# Patient Record
Sex: Male | Born: 1950 | Race: White | Hispanic: No | Marital: Married | Smoking: Never smoker
Health system: Southern US, Community
[De-identification: ages and names within clinical notes are randomized; demographics above are authoritative.]

## PROBLEM LIST (undated history)

## (undated) DIAGNOSIS — I7100 Dissection of unspecified site of aorta: Secondary | ICD-10-CM

## (undated) DIAGNOSIS — M1990 Unspecified osteoarthritis, unspecified site: Secondary | ICD-10-CM

## (undated) HISTORY — PX: OTHER SURGICAL HISTORY: SHX169

## (undated) HISTORY — PX: CARDIAC SURGERY: SHX584

## (undated) HISTORY — PX: TONSILLECTOMY: SUR1361

---

## 2011-08-11 ENCOUNTER — Emergency Department (INDEPENDENT_AMBULATORY_CARE_PROVIDER_SITE_OTHER)
Admission: EM | Admit: 2011-08-11 | Discharge: 2011-08-11 | Disposition: A | Discharge status: Home / Self Care | Payer: BC Managed Care – PPO | Source: Home / Self Care | Attending: Emergency Medicine | Admitting: Emergency Medicine

## 2011-08-11 DIAGNOSIS — J111 Influenza due to unidentified influenza virus with other respiratory manifestations: Secondary | ICD-10-CM

## 2011-08-11 DIAGNOSIS — R05 Cough: Secondary | ICD-10-CM

## 2011-08-11 DIAGNOSIS — R059 Cough, unspecified: Secondary | ICD-10-CM

## 2011-08-11 MED ORDER — GUAIFENESIN-CODEINE 100-10 MG/5ML PO SYRP: 5.0000 mL | ORAL_SOLUTION | Freq: Four times a day (QID) | ORAL | Status: AC | PRN

## 2011-08-11 NOTE — ED Provider Notes (Signed)
History     CSN: [id number]  Arrival date & time 08/11/11  1128   First MD Initiated Contact with Patient 08/11/11 1150      Chief Complaint  Patient presents with  . Fever    (Consider location/radiation/quality/duration/timing/severity/associated sxs/prior treatment) HPI Lonnie Mitchell is a 60 y.o. male who complains of onset of cold symptoms for 5 days. He had flu symptoms earlier this week, however he is feeling much better now. His wife encouraged him to come in to be checked since he was coughing a little more last night.  All the following symptoms although have resolved except for the cough which is still lingering. +sore throat + cough No pleuritic pain No wheezing + nasal congestion + post-nasal drainage + sinus pain/pressure + chest congestion No itchy/red eyes No earache No hemoptysis No SOB No chills/sweats + fever No nausea No vomiting No abdominal pain No diarrhea No skin rashes + fatigue + myalgias +headache    No past medical history on file.  No past surgical history on file.  No family history on file.  History  Substance Use Topics  . Smoking status: Not on file  . Smokeless tobacco: Not on file  . Alcohol Use: Not on file      Review of Systems  Allergies  Review of patient's allergies indicates no known allergies.  Home Medications   Current Outpatient Rx  Name Route Sig Dispense Refill  . IBUPROFEN 200 MG PO TABS Oral Take 200 mg by mouth every 6 (six) hours as needed.      . GUAIFENESIN-CODEINE 100-10 MG/5ML PO SYRP Oral Take 5 mLs by mouth 4 (four) times daily as needed for cough or congestion. 120 mL 0    BP 114/57  Pulse 81  Temp(Src) 97.9 F (36.6 C) (Oral)  Resp 16  Ht 5\' 7"  (1.702 m)  Wt 159 lb (72.122 kg)  BMI 24.90 kg/m2  SpO2 96%  Physical Exam  Nursing note and vitals reviewed. Constitutional: He is oriented to person, place, and time. He appears well-developed and well-nourished.  HENT:  Head: Normocephalic and  atraumatic.  Right Ear: Tympanic membrane, external ear and ear canal normal.  Left Ear: Tympanic membrane, external ear and ear canal normal.  Nose: Nose normal.  Mouth/Throat: No oropharyngeal exudate, posterior oropharyngeal edema or posterior oropharyngeal erythema.  Eyes: No scleral icterus.  Neck: Neck supple.  Cardiovascular: Regular rhythm and normal heart sounds.   Pulmonary/Chest: Effort normal and breath sounds normal. No respiratory distress.  Neurological: He is alert and oriented to person, place, and time.  Skin: Skin is warm and dry.  Psychiatric: He has a normal mood and affect. His speech is normal.    ED Course  Procedures (including critical care time)  Labs Reviewed - No data to display No results found.   1. Influenza   2. Cough       MDM  1) it seems to be that he had the flu earlier this week. Unlikely given the antibiotic for Tamiflu since he is getting much better. I have given him a prescription for cough medicine. If he is getting worse later this weekend or early next week, I told him to give [place] a call and we'll call in an antibiotic (Z-Pak). 2)  Use nasal saline solution (over the counter) at least 3 times a day. 3)  Use over the counter decongestants like Zyrtec-D every 12 hours as needed to help with congestion.  If you have hypertension, do not  take medicines with sudafed.  4)  Can take tylenol every 6 hours or motrin every 8 hours for pain or fever. 5)  Follow up with your primary doctor if no improvement in 5-7 days, sooner if increasing pain, fever, or new symptoms.     Lily Kocher, MD 08/11/11 1224

## 2011-08-11 NOTE — ED Notes (Signed)
Fever , cough x 4 days 

## 2016-01-02 ENCOUNTER — Encounter: Payer: Self-pay | Admitting: Emergency Medicine

## 2016-01-02 ENCOUNTER — Emergency Department (INDEPENDENT_AMBULATORY_CARE_PROVIDER_SITE_OTHER)
Admission: EM | Admit: 2016-01-02 | Discharge: 2016-01-02 | Disposition: A | Discharge status: Home / Self Care | Payer: BLUE CROSS/BLUE SHIELD | Source: Home / Self Care | Attending: Family Medicine | Admitting: Family Medicine

## 2016-01-02 ENCOUNTER — Emergency Department (INDEPENDENT_AMBULATORY_CARE_PROVIDER_SITE_OTHER): Payer: BLUE CROSS/BLUE SHIELD

## 2016-01-02 DIAGNOSIS — S40022A Contusion of left upper arm, initial encounter: Secondary | ICD-10-CM

## 2016-01-02 DIAGNOSIS — W19XXXA Unspecified fall, initial encounter: Secondary | ICD-10-CM

## 2016-01-02 DIAGNOSIS — S6992XA Unspecified injury of left wrist, hand and finger(s), initial encounter: Secondary | ICD-10-CM

## 2016-01-02 DIAGNOSIS — T1490 Injury, unspecified: Secondary | ICD-10-CM | POA: Diagnosis not present

## 2016-01-02 DIAGNOSIS — T1490XA Injury, unspecified, initial encounter: Secondary | ICD-10-CM

## 2016-01-02 HISTORY — DX: Dissection of unspecified site of aorta: I71.00

## 2016-01-02 HISTORY — DX: Unspecified osteoarthritis, unspecified site: M19.90

## 2016-01-02 NOTE — ED Notes (Signed)
Correction: took Aleve this morning.

## 2016-01-02 NOTE — Discharge Instructions (Signed)
Elevate arm.  Wear sling.  Apply ice pack for 20 to 30 minutes, every 1 to 2 hours.  Continue until pain and swelling decrease.  May take Aleve, 2 tabs twice daily.  Return (or go to ER) if increased swelling occurs around left ring finger.  Begin range of motion and stretching exercises as tolerated.    Contusion A contusion is a deep bruise. Contusions are the result of a blunt injury to tissues and muscle fibers under the skin. The injury causes bleeding under the skin. The skin overlying the contusion may turn blue, purple, or yellow. Minor injuries will give you a painless contusion, but more severe contusions may stay painful and swollen for a few weeks.  CAUSES  This condition is usually caused by a blow, trauma, or direct force to an area of the body. SYMPTOMS  Symptoms of this condition include:  Swelling of the injured area.  Pain and tenderness in the injured area.  Discoloration. The area may have redness and then turn blue, purple, or yellow. DIAGNOSIS  This condition is diagnosed based on a physical exam and medical history. An X-ray, CT scan, or MRI may be needed to determine if there are any associated injuries, such as broken bones (fractures). TREATMENT  Specific treatment for this condition depends on what area of the body was injured. In general, the best treatment for a contusion is resting, icing, applying pressure to (compression), and elevating the injured area. This is often called the RICE strategy. Over-the-counter anti-inflammatory medicines may also be recommended for pain control.  HOME CARE INSTRUCTIONS   Rest the injured area.  If directed, apply ice to the injured area:  Put ice in a plastic bag.  Place a towel between your skin and the bag.  Leave the ice on for 20 minutes, 2-3 times per day.  If directed, apply light compression to the injured area using an elastic bandage. Make sure the bandage is not wrapped too tightly. Remove and reapply the  bandage as directed by your health care provider.  If possible, raise (elevate) the injured area above the level of your heart while you are sitting or lying down.  Take over-the-counter and prescription medicines only as told by your health care provider. SEEK MEDICAL CARE IF:  Your symptoms do not improve after several days of treatment.  Your symptoms get worse.  You have difficulty moving the injured area. SEEK IMMEDIATE MEDICAL CARE IF:   You have severe pain.  You have numbness in a hand or foot.  Your hand or foot turns pale or cold.   This information is not intended to replace advice given to you by your health care provider. Make sure you discuss any questions you have with your health care provider.   Document Released: 05/17/2005 Document Revised: 04/28/2015 Document Reviewed: 12/23/2014 Elsevier Interactive Patient Education [employer]2016 [employer] Inc.

## 2016-01-02 NOTE — ED Provider Notes (Signed)
CSN: [id number]     Arrival date & time 01/02/16  1153 History   First MD Initiated Contact with Patient 01/02/16 1254     Chief Complaint  Patient presents with  . Arm Injury      HPI Comments: While playing baseball yesterday, patient fell on his left elbow/forearm.  He has had gradually increasing pain and swelling in his left elbow and forearm.   Patient is a 65 y.o. male presenting with arm injury. The history is provided by the patient.  Arm Injury Location:  Elbow and arm Time since incident:  1 day Injury: yes   Mechanism of injury: fall   Fall:    Fall occurred:  Running   Impact surface:  Dirt   Point of impact: left elbow and forearm. Arm location:  L forearm Elbow location:  L elbow Pain details:    Quality:  Aching   Radiates to:  L wrist   Severity:  Moderate   Onset quality:  Sudden   Duration:  1 day   Timing:  Constant   Progression:  Unchanged Chronicity:  New Handedness:  Right-handed Dislocation: no   Prior injury to area:  No Relieved by:  NSAIDs Worsened by:  Movement Ineffective treatments:  Ice Associated symptoms: decreased range of motion, stiffness and swelling   Associated symptoms: no fatigue, no muscle weakness, no numbness and no tingling     Past Medical History  Diagnosis Date  . Dissection, aorta (HCC)   . Arthritis    Past Surgical History  Procedure Laterality Date  . Aorta dissection    . Tonsillectomy     Family History  Problem Relation Age of Onset  . Hypertension Father   . Heart failure Father   . Heart failure Sister   . Heart failure Other   . Hypertension Other    Social History  Substance Use Topics  . Smoking status: Never Smoker   . Smokeless tobacco: None  . Alcohol Use: Yes    Review of Systems  Constitutional: Negative for fatigue.  Musculoskeletal: Positive for stiffness.  All other systems reviewed and are negative.   Allergies  Review of patient's allergies indicates no known  allergies.  Home Medications   Prior to Admission medications   Medication Sig Start Date End Date Taking? Authorizing Provider  ibuprofen (ADVIL,MOTRIN) 200 MG tablet Take 200 mg by mouth every 6 (six) hours as needed.      Historical Provider, MD   Meds Ordered and Administered this Visit  Medications - No data to display  BP 143/60 mmHg  Pulse 75  Temp(Src) 98.2 F (36.8 C) (Oral)  Resp 16  Ht 5\' 8"  (1.727 m)  Wt 160 lb (72.576 kg)  BMI 24.33 kg/m2  SpO2 98% No data found.   Physical Exam  Constitutional: He is oriented to person, place, and time. He appears well-developed and well-nourished. No distress.  HENT:  Head: Normocephalic.  Eyes: Pupils are equal, round, and reactive to light.  Neck: Normal range of motion.  Cardiovascular: Normal heart sounds.   Pulmonary/Chest: Breath sounds normal.  Musculoskeletal:       Left forearm: He exhibits tenderness, bony tenderness, swelling and edema. He exhibits no deformity and no laceration.       Arms: Patient has difficulty fully flexing and extending left elbow although there is only mild tenderness to palpation.  The proximal forearm and elbow are diffusely swollen.  There is tenderness to palpation over proximal forearm just distal  to elbow.  Distal neurovascular function is intact.  There is edema over dorsum of left hand.  Left 4th finger has a ring in place; unable to remove because of developing edema, but no vascular compromise is apparent.  Neurological: He is alert and oriented to person, place, and time.  Skin: Skin is warm and dry. No rash noted.  Nursing note and vitals reviewed.   ED Course  Procedures none  Imaging Review Dg Forearm Left  01/02/2016  CLINICAL DATA:  Fall last night, injury left forearm EXAM: LEFT FOREARM - 2 VIEW COMPARISON:  None. FINDINGS: Two views of left forearm submitted. No acute fracture or subluxation. Diffuse soft tissue swelling is noted left elbow and proximal left forearm.  IMPRESSION: No acute fracture or subluxation.  Diffuse soft tissue swelling. Electronically Signed   By: Natasha Mead M.D.   On: 01/02/2016 13:19      MDM   1. Contusion of left arm, initial encounter   2. Injury    Ace wrap applied.  Dispensed shoulder immobilizer. Elevate arm.  Wear sling.  Apply ice pack for 20 to 30 minutes, every 1 to 2 hours.  Continue until pain and swelling decrease.  May take Aleve, 2 tabs twice daily.  Return (or go to ER) if increased swelling occurs around left ring finger.  Begin range of motion and stretching exercises as tolerated.  Followup with Dr. Rodney Langton or Dr. Clementeen Graham ([facility]) if not improving about two weeks.     Lattie Haw, MD 01/02/16 [phone]

## 2016-01-02 NOTE — ED Notes (Signed)
Patient states he fell while playing baseball yesterday and landed on left forearm which is now edematous and painful. Took his daily ibuprofen this morning.

## 2016-01-03 ENCOUNTER — Emergency Department (INDEPENDENT_AMBULATORY_CARE_PROVIDER_SITE_OTHER)
Admission: EM | Admit: 2016-01-03 | Discharge: 2016-01-03 | Disposition: A | Discharge status: Home / Self Care | Payer: BLUE CROSS/BLUE SHIELD | Source: Home / Self Care | Attending: Family Medicine | Admitting: Family Medicine

## 2016-01-03 ENCOUNTER — Encounter: Payer: Self-pay | Admitting: Emergency Medicine

## 2016-01-03 DIAGNOSIS — W4904XD Ring or other jewelry causing external constriction, subsequent encounter: Secondary | ICD-10-CM | POA: Diagnosis not present

## 2016-01-03 DIAGNOSIS — S60449D External constriction of unspecified finger, subsequent encounter: Secondary | ICD-10-CM | POA: Diagnosis not present

## 2016-01-03 DIAGNOSIS — S4992XD Unspecified injury of left shoulder and upper arm, subsequent encounter: Secondary | ICD-10-CM

## 2016-01-03 NOTE — ED Notes (Signed)
Patient here with left hand and finger edema exacerbated by wedding ring; requesting assistance in removing ring.

## 2016-01-03 NOTE — ED Provider Notes (Signed)
CSN: [id number]     Arrival date & time 01/03/16  1630 History   First MD Initiated Contact with Patient 01/03/16 1637     No chief complaint on file.  (Consider location/radiation/quality/duration/timing/severity/associated sxs/prior Treatment) HPI The pt is a 65yo male presenting to [facility] with c/o worsening swelling and pain in left forearm and hand after a Left forearm injury 2 days ago falling on arm playing baseball. Pt was seen at [facility] yesterday. Imaging was negative for fracture. Pt given ace-wrap and shoulder immobilizer. Encouraged to ice every 1-2 hours and keep elevated. Pt has not been using ice today. Symptoms worsened when he went to work today.  No new injury. Pt states swelling has worsened around his wedding band causing some mild pain.   Past Medical History  Diagnosis Date  . Dissection, aorta (HCC)   . Arthritis    Past Surgical History  Procedure Laterality Date  . Aorta dissection    . Tonsillectomy     Family History  Problem Relation Age of Onset  . Hypertension Father   . Heart failure Father   . Heart failure Sister   . Heart failure Other   . Hypertension Other    Social History  Substance Use Topics  . Smoking status: Never Smoker   . Smokeless tobacco: Not on file  . Alcohol Use: Yes    Review of Systems  Musculoskeletal: Positive for myalgias, joint swelling and arthralgias.       Left forearm and hand  Skin: Positive for color change. Negative for wound.  Neurological: Negative for weakness and numbness.    Allergies  Review of patient's allergies indicates no known allergies.  Home Medications   Prior to Admission medications   Medication Sig Start Date End Date Taking? Authorizing Provider  ibuprofen (ADVIL,MOTRIN) 200 MG tablet Take 200 mg by mouth every 6 (six) hours as needed.      Historical Provider, MD   Meds Ordered and Administered this Visit  Medications - No data to display  There were no vitals taken for this visit. No  data found.   Physical Exam  Constitutional: He is oriented to person, place, and time. He appears well-developed and well-nourished.  HENT:  Head: Normocephalic and atraumatic.  Eyes: EOM are normal.  Neck: Normal range of motion.  Cardiovascular: Normal rate.   Pulses:      Radial pulses are 2+ on the left side.  Left hand: cap refill < 3 seconds  Pulmonary/Chest: Effort normal.  Musculoskeletal: Normal range of motion. He exhibits edema and tenderness.  Left arm: moderate to significant edema of Left forearm and hand. Full ROM shoulder and elbow. Mild limitation to flexion and extension of wrist and 4/5 grip strength with Left hand due to swelling. Wedding band in place on Left ring finger.muscle compartments are soft.  Neurological: He is alert and oriented to person, place, and time.  Left hand: normal sensation  Skin: Skin is warm and dry. No erythema.  Left forearm and hand: diffuse ecchymosis.  Psychiatric: He has a normal mood and affect. His behavior is normal.  Nursing note and vitals reviewed.   ED Course  Procedures (including critical care time)  Labs Review Labs Reviewed - No data to display  Imaging Review Dg Forearm Left  01/02/2016  CLINICAL DATA:  Fall last night, injury left forearm EXAM: LEFT FOREARM - 2 VIEW COMPARISON:  None. FINDINGS: Two views of left forearm submitted. No acute fracture or subluxation. Diffuse soft tissue  swelling is noted left elbow and proximal left forearm. IMPRESSION: No acute fracture or subluxation.  Diffuse soft tissue swelling. Electronically Signed   By: Natasha MeadLiviu  Pop M.D.   On: 01/02/2016 13:19     MDM   1. Constrictive jewelry of finger, subsequent encounter   2. Arm injury, left, subsequent encounter    [facility] after worsening swelling from Left arm injury.  Consulted with Dr. Benjamin Stainhekkekandam, Sports Medicine, who was able to remove ring using dental floss. No immediate complications.   Left arm re-wrapped with ace bandage. Pt shown how to properly wrap from hand up toward shoulder to help with swelling.  Encouraged to ice a few times a day. Elevate when possible.  F/u with Sports Medicine in 1-2 weeks for recheck of symptoms.    Junius Finnerrin O'Malley, PA-C 01/03/16 1701

## 2016-01-07 ENCOUNTER — Encounter: Payer: Self-pay | Admitting: Sports Medicine

## 2016-01-07 ENCOUNTER — Ambulatory Visit (INDEPENDENT_AMBULATORY_CARE_PROVIDER_SITE_OTHER): Payer: BLUE CROSS/BLUE SHIELD | Admitting: Sports Medicine

## 2016-01-07 VITALS — BP 151/64 | HR 81 | Resp 16 | Wt 161.0 lb

## 2016-01-07 DIAGNOSIS — S5012XA Contusion of left forearm, initial encounter: Secondary | ICD-10-CM | POA: Insufficient documentation

## 2016-01-07 NOTE — Progress Notes (Signed)
   Subjective:    I'm seeing this patient as a consultation for:  Junius FinnerErin O'Malley PA-C  CC: Left forearm swelling  HPI: This is a pleasant 65 year old male, a week ago he slipped and fell, he had immediate pain, swelling, bruising over his left forearm, I removed his ring in the urgent care, and he was referred to me for further evaluation and definitive treatment of his forearm swelling/hematoma. X-rays were negative. Symptoms are mild, improving.  Past medical history, Surgical history, Family history not pertinant except as noted below, Social history, Allergies, and medications have been entered into the medical record, reviewed, and no changes needed.   Review of Systems: No headache, visual changes, nausea, vomiting, diarrhea, constipation, dizziness, abdominal pain, skin rash, fevers, chills, night sweats, weight loss, swollen lymph nodes, body aches, joint swelling, muscle aches, chest pain, shortness of breath, mood changes, visual or auditory hallucinations.   Objective:   General: Well Developed, well nourished, and in no acute distress.  Neuro/Psych: Alert and oriented x3, extra-ocular muscles intact, able to move all 4 extremities, sensation grossly intact. Skin: Warm and dry, no rashes noted.  Respiratory: Not using accessory muscles, speaking in full sentences, trachea midline.  Cardiovascular: Pulses palpable, no extremity edema. Abdomen: Does not appear distended. Left Elbow: Visible large hematoma distal to the elbow joint. Range of motion full pronation, supination, flexion, extension. Wrist also with full range of motion and full strength in all directions Strength is full to all of the above directions Stable to varus, valgus stress. Negative moving valgus stress test. No discrete areas of tenderness to palpation. Ulnar nerve does not sublux. Negative cubital tunnel Tinel's.  Procedure: Real-time Ultrasound Guided Injection of traumatic hematoma left forearm Device:  GE Logiq E  Verbal informed consent obtained.  Time-out conducted.  Noted no overlying erythema, induration, or other signs of local infection.  Skin prepped in a sterile fashion.  Local anesthesia: Topical Ethyl chloride.  With sterile technique and under real time ultrasound guidance:  Noted organized hematoma in the left forearm, and somewhat intramuscular the extensor carpi radialis longus and likely brachioadialis, 18-gauge needle advanced into this guidance, unable to aspirate any fluid, syringe switched and 1 mL lidocaine, 1 mL Kenalog 40 injected easily. Completed without difficulty  Pain immediately resolved suggesting accurate placement of the medication.  Advised to call if fevers/chills, erythema, induration, drainage, or persistent bleeding.  Images permanently stored and available for review in the ultrasound unit.  Impression: Technically successful ultrasound guided injection.  The arm was then strapped with compressive dressing.  Impression and Recommendations:   This case required medical decision making of moderate complexity.

## 2016-01-07 NOTE — Assessment & Plan Note (Addendum)
Attempt at aspiration with injection. Discontinue sling. Exam is fairly benign with the exception of some swelling. We were able to get his ring off at the last visit in urgent care without any complications. Strapped the arm again with compressive dressing, he will return to see me in 2 weeks.

## 2016-01-20 ENCOUNTER — Encounter: Payer: Self-pay | Admitting: Sports Medicine

## 2016-01-20 ENCOUNTER — Ambulatory Visit (INDEPENDENT_AMBULATORY_CARE_PROVIDER_SITE_OTHER): Payer: BLUE CROSS/BLUE SHIELD | Admitting: Sports Medicine

## 2016-01-20 VITALS — BP 147/61 | HR 76 | Resp 18 | Wt 157.0 lb

## 2016-01-20 DIAGNOSIS — S5012XD Contusion of left forearm, subsequent encounter: Secondary | ICD-10-CM

## 2016-01-20 NOTE — Progress Notes (Signed)
  Subjective:    CC: Follow-up  HPI: Left forearm hematoma: Improved with softening of the hematoma after injection at the last visit, but still with some persistence of the swelling. Pain is mild.  Past medical history, Surgical history, Family history not pertinant except as noted below, Social history, Allergies, and medications have been entered into the medical record, reviewed, and no changes needed.   Review of Systems: No fevers, chills, night sweats, weight loss, chest pain, or shortness of breath.   Objective:    General: Well Developed, well nourished, and in no acute distress.  Neuro: Alert and oriented x3, extra-ocular muscles intact, sensation grossly intact.  HEENT: Normocephalic, atraumatic, pupils equal round reactive to light, neck supple, no masses, no lymphadenopathy, thyroid nonpalpable.  Skin: Warm and dry, no rashes. Cardiac: Regular rate and rhythm, no murmurs rubs or gallops, no lower extremity edema.  Respiratory: Clear to auscultation bilaterally. Not using accessory muscles, speaking in full sentences.  Complicated Incision and evacuation of deep hematoma, forearm, left. Risks, benefits, and alternatives explained and consent obtained. Time out conducted. Surface cleaned with alcohol. 10cc lidocaine with epinephine infiltrated around hematoma. Adequate anesthesia ensured. Area prepped and draped in a sterile fashion. #11 blade used to make a stab incision into abscess. Large amounts of hematoma was expressed with pressure Curved hemostat used to explore 4 quadrants and loculations broken up. Further hematoma expressed Incision was then closed with a 4-0 Prolene horizontal mattress suture. Small amount of Dermabond applied over the top of the incision. Hemostasis achieved. Pt stable. Aftercare and follow-up advised. The area was then strapped with compressive dressing.  Impression and Recommendations:

## 2016-01-20 NOTE — Assessment & Plan Note (Signed)
Softened somewhat with injection at the last visit, incision and hematoma evacuation performed today. Closed with a single horizontal mattress suture, strapped with compressive dressing, return in one week to consider suture removal, I did place a bit of Dermabond over the incision

## 2016-01-26 ENCOUNTER — Ambulatory Visit (INDEPENDENT_AMBULATORY_CARE_PROVIDER_SITE_OTHER): Payer: BLUE CROSS/BLUE SHIELD | Admitting: Sports Medicine

## 2016-01-26 ENCOUNTER — Encounter: Payer: Self-pay | Admitting: Sports Medicine

## 2016-01-26 DIAGNOSIS — S5012XD Contusion of left forearm, subsequent encounter: Secondary | ICD-10-CM

## 2016-01-26 MED ORDER — DOXYCYCLINE HYCLATE 100 MG PO TABS: 100.0000 mg | ORAL_TABLET | Freq: Two times a day (BID) | ORAL | Status: AC

## 2016-01-26 NOTE — Assessment & Plan Note (Signed)
Sutures removed, mild cellulitis, there is some breakdown of the skin after degloving injury. There is only serous and nonpurulent discharge. Closed with Steri-Strips and Dermabond, compressed, referral to wound care for assistance. Adding doxycycline.

## 2016-01-26 NOTE — Progress Notes (Signed)
  Subjective:  Follow-up after hematoma evacuation, overall doing well, does have some skin breakdown approximately with serous drainage, some stiffness but no fevers, chills, or other constitutional symptoms.  Objective: General: Well-developed, well-nourished, and in no acute distress. Left forearm: Minimally swollen, mildly erythema with induration, there is some skin breakdown proximal to the incision site, there is simply a serous discharge from this location.  The skin breakdown was closed with Steri-Strips and Dermabond and I removed his sutures. Good motion of the elbow without pain.  Assessment/plan:

## 2016-01-31 ENCOUNTER — Telehealth: Payer: Self-pay

## 2016-01-31 DIAGNOSIS — S5012XD Contusion of left forearm, subsequent encounter: Secondary | ICD-10-CM

## 2016-01-31 NOTE — Telephone Encounter (Signed)
Continue antibiotics and placing referral [place]

## 2016-01-31 NOTE — Telephone Encounter (Signed)
Pt left VM stating he has not heard from would clinic and he has a couple more lesions that are oozing yellow liquid. Pt would also like to know if [place] [place]. Please advise.

## 2016-02-09 ENCOUNTER — Encounter: Payer: Self-pay | Admitting: Sports Medicine

## 2016-02-09 ENCOUNTER — Ambulatory Visit (INDEPENDENT_AMBULATORY_CARE_PROVIDER_SITE_OTHER): Payer: BLUE CROSS/BLUE SHIELD | Admitting: Sports Medicine

## 2016-02-09 VITALS — BP 167/62 | HR 80 | Resp 18 | Wt 161.7 lb

## 2016-02-09 DIAGNOSIS — S5012XD Contusion of left forearm, subsequent encounter: Secondary | ICD-10-CM

## 2016-02-09 NOTE — Assessment & Plan Note (Addendum)
This is unfortunately now resulted in breakdown of the skin down to the muscle fascia, the skin was likely devitalized when distended with a hematoma. There is an opening approximately 4 cm x 6 cm across He is now seeing a Engineer, petroleumplastic surgeon, who seems to be planning for a wound VAC rather than skin grafting. I will follow along, return to see me in one month.

## 2016-02-09 NOTE — Progress Notes (Signed)
  Subjective:    CC: Follow-up  HPI: This is a pleasant 65 year old male, he had a traumatic hematoma on his forearm, this was evacuated, but unfortunately the skin was devitalized long enough to cause breakdown. He now has a skin defect approximately 6 cm x 4 cm across, we have had him seeing wound care, more recently he has got an appointment with plastic surgery for consideration of wound VAC. He has no pain.  Past medical history, Surgical history, Family history not pertinant except as noted below, Social history, Allergies, and medications have been entered into the medical record, reviewed, and no changes needed.   Review of Systems: No fevers, chills, night sweats, weight loss, chest pain, or shortness of breath.   Objective:    General: Well Developed, well nourished, and in no acute distress.  Neuro: Alert and oriented x3, extra-ocular muscles intact, sensation grossly intact.  HEENT: Normocephalic, atraumatic, pupils equal round reactive to light, neck supple, no masses, no lymphadenopathy, thyroid nonpalpable.  Skin: Warm and dry, no rashes. Cardiac: Regular rate and rhythm, no murmurs rubs or gallops, no lower extremity edema.  Respiratory: Clear to auscultation bilaterally. Not using accessory muscles, speaking in full sentences. Left forearm: 6 x 4 cm skin defect with visible muscle fascia underneath, no erythema, no induration, no drainage.  Impression and Recommendations:

## 2016-03-15 ENCOUNTER — Ambulatory Visit: Payer: BLUE CROSS/BLUE SHIELD | Admitting: Sports Medicine

## 2016-10-10 DIAGNOSIS — D649 Anemia, unspecified: Secondary | ICD-10-CM | POA: Insufficient documentation

## 2016-10-10 DIAGNOSIS — I10 Essential (primary) hypertension: Secondary | ICD-10-CM | POA: Insufficient documentation

## 2016-10-10 DIAGNOSIS — E785 Hyperlipidemia, unspecified: Secondary | ICD-10-CM | POA: Insufficient documentation

## 2016-10-10 DIAGNOSIS — D696 Thrombocytopenia, unspecified: Secondary | ICD-10-CM | POA: Insufficient documentation

## 2016-12-13 ENCOUNTER — Ambulatory Visit (INDEPENDENT_AMBULATORY_CARE_PROVIDER_SITE_OTHER): Payer: BLUE CROSS/BLUE SHIELD | Admitting: Family Medicine

## 2016-12-13 VITALS — BP 91/55 | HR 71 | Temp 98.1°F | Wt 149.0 lb

## 2016-12-13 DIAGNOSIS — L600 Ingrowing nail: Secondary | ICD-10-CM | POA: Diagnosis not present

## 2016-12-13 MED ORDER — TRAMADOL HCL 50 MG PO TABS: 50.0000 mg | ORAL_TABLET | Freq: Four times a day (QID) | ORAL | 0 refills | Status: DC | PRN

## 2016-12-13 NOTE — Progress Notes (Signed)
Subjective:    Patient ID: Lonnie Mitchell, male    DOB: Apr 30, 1951, 66 y.o.   MRN: [id number]  HPI 66 year old male status post open heart surgery 8 weeks ago comes in today complaining of ingrown toenail on the left great toe.  Assessment bothering him on and off for weeks but it's benefit been more sore the last couple of days. No fevers chills or sweats. He has had to have this same nail removed in the past.     Review of Systems  BP (!) 91/55   Pulse 71   Temp 98.1 F (36.7 C) (Oral)   Wt 149 lb (67.6 kg)   BMI 22.66 kg/m     No Known Allergies  Past Medical History:  Diagnosis Date  . Arthritis   . Dissection, aorta [facility])     Past Surgical History:  Procedure Laterality Date  . aorta dissection    . TONSILLECTOMY      Social History   Social History  . Marital status: Married    Spouse name: N/A  . Number of children: N/A  . Years of education: N/A   Occupational History  . Not on file.   Social History Main Topics  . Smoking status: Never Smoker  . Smokeless tobacco: Not on file  . Alcohol use Yes  . Drug use: Unknown  . Sexual activity: Not on file   Other Topics Concern  . Not on file   Social History Narrative  . No narrative on file    Family History  Problem Relation Age of Onset  . Hypertension Father   . Heart failure Father   . Heart failure Sister   . Heart failure Other   . Hypertension Other     Outpatient Encounter Prescriptions as of 12/13/2016  Medication Sig  . aspirin EC 81 MG tablet Take 81 mg by mouth daily.  Marland Kitchen atorvastatin (LIPITOR) 40 MG tablet Take 40 mg by mouth at bedtime.  . carvedilol (COREG) 3.125 MG tablet Take 3.125 mg by mouth 2 (two) times daily.  Marland Kitchen ibuprofen (ADVIL,MOTRIN) 200 MG tablet Take 200 mg by mouth every 6 (six) hours as needed. Reported on 02/09/2016  . lisinopril (PRINIVIL,ZESTRIL) 5 MG tablet Take 5 mg by mouth daily.  . tadalafil (CIALIS) 10 MG tablet Take 10 mg by mouth daily as needed.  .  traMADol (ULTRAM) 50 MG tablet Take 1 tablet (50 mg total) by mouth every 6 (six) hours as needed.   No facility-administered encounter medications on file as of 12/13/2016.          Objective:   Physical Exam  Constitutional: He is oriented to person, place, and time. He appears well-developed and well-nourished.  HENT:  Head: Normocephalic and atraumatic.  Eyes: Conjunctivae and EOM are normal.  Cardiovascular: Normal rate.   Pulmonary/Chest: Effort normal.  Neurological: He is alert and oriented to person, place, and time.  Skin: Skin is dry. No pallor.  Left great toe along the lateral border it is erythematous. When I pressed on the area I actually was able to express some green colored pus. It is tender at the distal edge of the nail. No tenderness over the base of the nail with a matrixes. He has some thickening and deformity of the distal end of the nail as well.  Psychiatric: He has a normal mood and affect. His behavior is normal.  Vitals reviewed.         Assessment & Plan:  Left great  ingrown toenail - Discussed options. Recommend partial nail removal. Patient tolerated procedure well. Follow-up wound care recommend elevate today. Ice as needed. Can use Tylenol.  Toenail Avulsion Procedure Note  Pre-operative Diagnosis: Left Ingrown Great toenail   Post-operative Diagnosis: Left Ingrown Great toenail  Indications: pain, infection   Anesthesia: Lidocaine 1% without epinephrine without added sodium bicarbonate  Procedure Details   The risks (including bleeding and infection) and benefits of the  procedure and Verbal informed consent obtained.  After digital block anesthesia was obtained, a tourniquet was applied for hemostasis during the procedure.  After prepping with Hibiclens, the offending edge of the nail was freed from the nailbed and perionychium, and then split with scissors and removed with  forceps.  All visible granulation tissue is debrided.  Antibiotic and bulky dressing was applied.   Findings: Ingrown toenail with infection.   Complications: none.  Plan: 1. The dressing on until tomorrow. Okay to remove and get wet in the shower. Pat dry and apply Vaseline and cover for a couple of days. 2. Warning signs of infection were reviewed.   3. Recommended that the patient use tramadol and tyelnol as needed for pain. Avoid NSAIDs as he had recent open heart surgery. 4. Return PRN

## 2016-12-13 NOTE — Patient Instructions (Addendum)
Fingernail or Toenail Removal, Adult, Care After  This sheet gives you information about how to care for yourself after your procedure. Your health care provider may also give you more specific instructions. If you have problems or questions, contact your health care provider.  What can I expect after the procedure?  After the procedure, it is common to have:  · Pain.  · Redness.  · Swelling.  · Soreness.     Follow these instructions at home:  · If you have a splint:  ? Do not put pressure on any part of the splint until it is fully hardened. This may take several hours.  ? Wear the splint as told by your health care provider. Remove it only as told by your health care provider.  ? Loosen the splint if your fingers or toes tingle, become numb, or turn cold and blue.  ? Keep the splint clean.  ? If the splint is not waterproof:  § Do not let it get wet.  § Cover it with a watertight covering when you take a bath or a shower.  Wound care        · Follow instructions from your health care provider about how to take care of your wound. Make sure you:  ? Wash your hands with soap and water before you change your bandage (dressing). If soap and water are not available, use hand sanitizer.  ? Change your dressing as told by your health care provider.  ? Keep your dressing dry until your health care provider says it can be removed.  ? Leave stitches (sutures), skin glue, or adhesive strips in place. These skin closures may need to stay in place for 2 weeks or longer. If adhesive strip edges start to loosen and curl up, you may trim the loose edges. Do not remove adhesive strips completely unless your health care provider tells you to do that.  · Check your wound every day for signs of infection. Check for:  ? More redness, swelling, or pain.  ? More fluid or blood.  ? Warmth.  ? Pus or a bad smell.  Managing pain, stiffness, and swelling   · Move your fingers or toes often to avoid stiffness and to lessen  swelling.  · Raise (elevate) the injured area above the level of your heart while you are sitting or lying down. You may need to keep your finger or toe raised or supported on a pillow for 24 hours or as told by your health care provider.  · Soak your hand or foot in warm, soapy water for 10-20 minutes, 3 times a day or as told by your health care provider.  Medicine   · Take over-the-counter and prescription medicines only as told by your health care provider.  · If you were prescribed an antibiotic medicine, use it as told by your health care provider. Do not stop using the antibiotic even if your condition improves.  General instructions   · If you were given a shoe to wear, wear it as told by your health care provider.  · Keep all follow-up visits as told by your health care provider. This is important.  Contact a health care provider if:  · You have more redness, swelling, or pain around your wound.  · You have more fluid or blood coming from your wound.  · Your wound feels warm to the touch.  · You have pus or a bad smell coming from your wound.  · You   have a fever.  · Your finger or toe looks blue or black.  This information is not intended to replace advice given to you by your health care provider. Make sure you discuss any questions you have with your health care provider.  Document Released: 08/28/2014 Document Revised: 04/05/2016 Document Reviewed: 02/14/2016  Elsevier Interactive Patient Education © 2017 [employer]   

## 2019-05-28 ENCOUNTER — Other Ambulatory Visit: Payer: Self-pay

## 2019-05-28 ENCOUNTER — Encounter: Payer: Self-pay | Admitting: Emergency Medicine

## 2019-05-28 ENCOUNTER — Emergency Department (INDEPENDENT_AMBULATORY_CARE_PROVIDER_SITE_OTHER)
Admission: EM | Admit: 2019-05-28 | Discharge: 2019-05-28 | Disposition: A | Discharge status: Home / Self Care | Payer: BC Managed Care – PPO | Source: Home / Self Care

## 2019-05-28 DIAGNOSIS — M722 Plantar fascial fibromatosis: Secondary | ICD-10-CM

## 2019-05-28 DIAGNOSIS — M79672 Pain in left foot: Secondary | ICD-10-CM | POA: Diagnosis not present

## 2019-05-28 DIAGNOSIS — I10 Essential (primary) hypertension: Secondary | ICD-10-CM

## 2019-05-28 DIAGNOSIS — Z9114 Patient's other noncompliance with medication regimen: Secondary | ICD-10-CM

## 2019-05-28 MED ORDER — IBUPROFEN 200 MG PO TABS: 200.0000 mg | ORAL_TABLET | Freq: Four times a day (QID) | ORAL | 0 refills | Status: DC | PRN

## 2019-05-28 NOTE — Discharge Instructions (Addendum)
°  You may take 500mg  acetaminophen every 4-6 hours as needed for pain and inflammation.  Due to your history of aortic dissection and thrombocytopenia, it is safest if you avoid NSAIDs such as ibuprofen (Motrin or Advil) and naproxen (Aleve) to help prevent stomach bleeds or other bleeding complications.   Your blood pressure was elevated today. It is important to get reestablished with your family doctor and start back on your blood pressure medication to help limit your risk of stroke, heart disease such as a heart attack, kidney injury or failure and early death.

## 2019-05-28 NOTE — ED Provider Notes (Signed)
[facility]    CSN: [id number] Arrival date & time: 05/28/19  1821      History   Chief Complaint Chief Complaint  Patient presents with  . Foot Pain    HPI Lonnie Mitchell is a 68 y.o. male.   HPI Lonnie Mitchell is a 68 y.o. male presenting to UC with c/o Left foot pain for about 2 weeks, pain is worst around the heal and arch of his foot.  No known injury. Pain is worse at the end of the day.  He has tried Tylenol with mild relief.  Pain is burning at times, 5/10.     Past Medical History:  Diagnosis Date  . Arthritis   . Dissection, aorta [facility])     Patient Active Problem List   Diagnosis Date Noted  . HLD (hyperlipidemia) 10/10/2016  . HTN (hypertension) 10/10/2016  . Thrombocytopenia (HCC) 10/10/2016  . Anemia 10/10/2016  . Traumatic hematoma of left forearm 01/07/2016    Past Surgical History:  Procedure Laterality Date  . aorta dissection    . TONSILLECTOMY         Home Medications    Prior to Admission medications   Not on File    Family History Family History  Problem Relation Age of Onset  . Hypertension Father   . Heart failure Father   . Heart failure Sister   . Heart failure Other   . Hypertension Other     Social History Social History   Tobacco Use  . Smoking status: Never Smoker  . Smokeless tobacco: Never Used  Substance Use Topics  . Alcohol use: Yes  . Drug use: Not on file     Allergies   Patient has no known allergies.   Review of Systems Review of Systems  Musculoskeletal: Positive for arthralgias. Negative for joint swelling.  Skin: Negative for color change and wound.  Neurological: Negative for weakness and numbness.     Physical Exam Triage Vital Signs ED Triage Vitals  Enc Vitals Group     BP 05/28/19 1834 (!) 182/91     Pulse Rate 05/28/19 1834 61     Resp --      Temp 05/28/19 1834 97.9 F (36.6 C)     Temp Source 05/28/19 1834 Oral     SpO2 05/28/19 1834 98 %     Weight 05/28/19 1835 160  lb (72.6 kg)     Height 05/28/19 1835 5\' 7"  (1.702 m)     Head Circumference --      Peak Flow --      Pain Score 05/28/19 1835 5     Pain Loc --      Pain Edu? --      Excl. in GC? --    No data found.  Updated Vital Signs BP (!) 182/91 (BP Location: Right Arm)   Pulse 61   Temp 97.9 F (36.6 C) (Oral)   Ht 5\' 7"  (1.702 m)   Wt 160 lb (72.6 kg)   SpO2 98%   BMI 25.06 kg/m   Visual Acuity Right Eye Distance:   Left Eye Distance:   Bilateral Distance:    Right Eye Near:   Left Eye Near:    Bilateral Near:     Physical Exam Vitals signs and nursing note reviewed.  Constitutional:      Appearance: Normal appearance. He is well-developed.  HENT:     Head: Normocephalic and atraumatic.  Neck:     Musculoskeletal: Normal  range of motion.  Cardiovascular:     Rate and Rhythm: Normal rate and regular rhythm.     Pulses:          Dorsalis pedis pulses are 2+ on the left side.       Posterior tibial pulses are 2+ on the left side.  Pulmonary:     Effort: Pulmonary effort is normal.  Musculoskeletal: Normal range of motion.        General: Tenderness present. No swelling.     Comments: Left foot: no edema, tenderness to plantar aspect of heal and arch of foot. Full ROM ankle and toes. No crepitus.  Skin:    General: Skin is warm and dry.     Capillary Refill: Capillary refill takes less than 2 seconds.     Findings: No bruising or erythema.  Neurological:     Mental Status: He is alert and oriented to person, place, and time.     Sensory: No sensory deficit.  Psychiatric:        Behavior: Behavior normal.      UC Treatments / Results  Labs (all labs ordered are listed, but only abnormal results are displayed) Labs Reviewed - No data to display  EKG   Radiology No results found.  Procedures Procedures (including critical care time)  Medications Ordered in UC Medications - No data to display  Initial Impression / Assessment and Plan / UC Course  I  have reviewed the triage vital signs and the nursing notes.  Pertinent labs & imaging results that were available during my care of the patient were reviewed by me and considered in my medical decision making (see chart for details).     Hx and exam c/w plantar fascitis  BP is elevated, pt states he is suppose to be on medication but stopped taking his medication and does not f/u as routinely as he should with his PCP Denies HA, dizziness or chest pain Encouraged f/u with PCP  AVS provided.  Final Clinical Impressions(s) / UC Diagnoses   Final diagnoses:  Left foot pain  Plantar fasciitis of left foot  Uncontrolled hypertension  Noncompliance with medications     Discharge Instructions      You may take 500mg  acetaminophen every 4-6 hours as needed for pain and inflammation.  Due to your history of aortic dissection and thrombocytopenia, it is safest if you avoid NSAIDs such as ibuprofen (Motrin or Advil) and naproxen (Aleve) to help prevent stomach bleeds or other bleeding complications.   Your blood pressure was elevated today. It is important to get reestablished with your family doctor and start back on your blood pressure medication to help limit your risk of stroke, heart disease such as a heart attack, kidney injury or failure and early death.      ED Prescriptions    Medication Sig Dispense Auth. Provider   ibuprofen (ADVIL) 200 MG tablet  (Status: Discontinued) Take 1-2 tablets (200-400 mg total) by mouth every 6 (six) hours as needed. Reported on 02/09/2016 20 tablet Noe Gens, [place]     PDMP not reviewed this encounter.   Noe Gens, PA-C 05/29/19 1249

## 2019-05-28 NOTE — ED Triage Notes (Signed)
LT foot pain x 2 weeks, heal and arch pain

## 2021-08-26 ENCOUNTER — Other Ambulatory Visit: Payer: Self-pay

## 2021-08-26 ENCOUNTER — Encounter: Payer: Self-pay | Admitting: Emergency Medicine

## 2021-08-26 ENCOUNTER — Emergency Department (INDEPENDENT_AMBULATORY_CARE_PROVIDER_SITE_OTHER)
Admission: EM | Admit: 2021-08-26 | Discharge: 2021-08-26 | Disposition: A | Discharge status: Home / Self Care | Payer: BC Managed Care – PPO | Source: Home / Self Care | Attending: Family Medicine | Admitting: Family Medicine

## 2021-08-26 ENCOUNTER — Emergency Department (INDEPENDENT_AMBULATORY_CARE_PROVIDER_SITE_OTHER): Payer: BC Managed Care – PPO

## 2021-08-26 DIAGNOSIS — M5416 Radiculopathy, lumbar region: Secondary | ICD-10-CM

## 2021-08-26 DIAGNOSIS — M545 Low back pain, unspecified: Secondary | ICD-10-CM | POA: Diagnosis not present

## 2021-08-26 MED ORDER — PREDNISONE 20 MG PO TABS: ORAL_TABLET | ORAL | 0 refills | Status: AC

## 2021-08-26 NOTE — Discharge Instructions (Signed)
Apply ice pack mid-lower back for 20 to 30 minutes, 3 to 4 times daily  Continue until pain decreases.  Continue lidocaine patch at bedtime for 12 hours.

## 2021-08-26 NOTE — ED Triage Notes (Signed)
Right sided back pain x 3 days  Radiates down right leg  OTC  tylenol - no relief Lidocaine 5% patches over night have helped  Denies any injury

## 2021-08-26 NOTE — ED Provider Notes (Signed)
[facility]    CSN: [id number] Arrival date & time: 08/26/21  1054      History   Chief Complaint Chief Complaint  Patient presents with   Back Pain    right    HPI Lonnie Mitchell is a 71 y.o. male.   About one week ago patient developed right side lower back pain that began to radiate to his right posterior thigh with movement.   He denies bowel or bladder dysfunction, and no saddle numbness.  He denies injury and changes in activities.  He states that his pain decreases at night when he applies a lidocaine patch.  The history is provided by the patient.  Back Pain Location:  Lumbar spine and gluteal region Quality:  Aching Radiates to:  R posterior upper leg Pain severity:  Mild Pain is:  Same all the time Onset quality:  Sudden Duration:  3 days Timing:  Constant Progression:  Worsening Chronicity:  New Context: not lifting heavy objects, not recent injury and not twisting   Relieved by: lidocaine patch. Worsened by:  Ambulation, bending, movement and standing Ineffective treatments:  OTC medications Associated symptoms: no abdominal pain, no abdominal swelling, no bladder incontinence, no bowel incontinence, no dysuria, no fever, no leg pain, no numbness, no paresthesias, no perianal numbness, no weakness and no weight loss    Past Medical History:  Diagnosis Date   Arthritis    Dissection, aorta [facility])     Patient Active Problem List   Diagnosis Date Noted   HLD (hyperlipidemia) 10/10/2016   HTN (hypertension) 10/10/2016   Thrombocytopenia (HCC) 10/10/2016   Anemia 10/10/2016   Traumatic hematoma of left forearm 01/07/2016    Past Surgical History:  Procedure Laterality Date   aorta dissection     CARDIAC SURGERY     TONSILLECTOMY         Home Medications    Prior to Admission medications   Medication Sig Start Date End Date Taking? Authorizing Provider  aspirin 81 MG EC tablet Take by mouth. 10/25/16  Yes [provider]   atorvastatin (LIPITOR) 40 MG tablet TAKE ONE TABLET BY MOUTH ONE TIME DAILY IN THE EVENING AT 6 PM 07/27/20  Yes [provider]  B Complex Vitamins (VITAMIN B-COMPLEX) TABS Take by mouth. 07/14/20  Yes [provider]  carvedilol (COREG) 12.5 MG tablet Take by mouth. 07/27/20 08/03/22 Yes [provider]  losartan (COZAAR) 50 MG tablet Take 1 tablet by mouth daily. 08/03/21  Yes [provider]  predniSONE (DELTASONE) 20 MG tablet Take one tab by mouth twice daily for 4 days, then one daily.. Take with food. 08/26/21  Yes Lattie Haw, MD  ascorbic acid (VITAMIN C) 1000 MG tablet Take by mouth.    [provider]    Family History Family History  Problem Relation Age of Onset   Cancer Mother    Dementia Mother    Hypertension Father    Heart failure Father    Heart failure Sister    Heart failure Other    Hypertension Other     Social History Social History   Tobacco Use   Smoking status: Never    Passive exposure: Never   Smokeless tobacco: Never  Vaping Use   Vaping Use: Never used  Substance Use Topics   Alcohol use: Not Currently   Drug use: Never     Allergies   Patient has no known allergies.   Review of Systems Review of Systems  Constitutional:  Negative for fever and weight loss.  Gastrointestinal:  Negative for abdominal pain and bowel incontinence.  Genitourinary:  Negative for bladder incontinence and dysuria.  Musculoskeletal:  Positive for back pain.  Neurological:  Negative for weakness, numbness and paresthesias.    Physical Exam Triage Vital Signs ED Triage Vitals  Enc Vitals Group     BP 08/26/21 1217 (!) 157/69     Pulse Rate 08/26/21 1217 (!) 58     Resp 08/26/21 1217 16     Temp 08/26/21 1217 98.3 F (36.8 C)     Temp Source 08/26/21 1217 Oral     SpO2 08/26/21 1217 97 %     Weight 08/26/21 1219 165 lb (74.8 kg)     Height 08/26/21 1219 5\' 7"  (1.702 m)     Head Circumference --      Peak  Flow --      Pain Score 08/26/21 1218 4     Pain Loc --      Pain Edu? --      Excl. in GC? --    No data found.  Updated Vital Signs BP (!) 157/69 (BP Location: Right Arm)    Pulse (!) 58    Temp 98.3 F (36.8 C) (Oral)    Resp 16    Ht 5\' 7"  (1.702 m)    Wt 74.8 kg    SpO2 97%    BMI 25.84 kg/m   Visual Acuity Right Eye Distance:   Left Eye Distance:   Bilateral Distance:    Right Eye Near:   Left Eye Near:    Bilateral Near:     Physical Exam Vitals and nursing note reviewed.  Constitutional:      General: He is not in acute distress.    Appearance: He is not ill-appearing.  HENT:     Head: Normocephalic.     Nose: Nose normal.     Mouth/Throat:     Pharynx: Oropharynx is clear.  Eyes:     Conjunctiva/sclera: Conjunctivae normal.     Pupils: Pupils are equal, round, and reactive to light.  Cardiovascular:     Rate and Rhythm: Regular rhythm. Bradycardia present.     Heart sounds: Normal heart sounds.  Pulmonary:     Breath sounds: Normal breath sounds.  Abdominal:     Palpations: Abdomen is soft.     Tenderness: There is no abdominal tenderness. There is no right CVA tenderness or left CVA tenderness.  Musculoskeletal:     Cervical back: Normal range of motion.       Back:     Right lower leg: No edema.     Left lower leg: No edema.     Comments: Patient's pain localized to the right buttock but there is no tenderness to palpation at that location.  Back:  Range of motion relatively well preserved.  Can heel/toe walk and squat without difficulty. Straight leg raising test is positive on the right at 45 degrees.  Sitting knee extension test is negative.  Strength and sensation in the lower extremities is normal.  Patellar and achilles reflexes are normal   Skin:    General: Skin is warm and dry.     Findings: No rash.  Neurological:     Mental Status: He is alert.     UC Treatments / Results  Labs (all labs ordered are listed, but only abnormal results  are displayed) Labs Reviewed - No data to display  EKG  Radiology DG Lumbar Spine Complete  Result Date: 08/26/2021 CLINICAL DATA:  Right lower back pain radiating to the right leg for the past week. EXAM: LUMBAR SPINE - COMPLETE 4+ VIEW COMPARISON:  None. FINDINGS: Five lumbar type vertebral bodies. No acute fracture or subluxation. Vertebral body heights are preserved. Alignment is normal. Mild disc height loss at L2-L3 and L5-S1. Mild diffuse lumbar facet arthropathy. The sacroiliac joints are unremarkable. IMPRESSION: 1. Mild lumbar spondylosis. No acute osseous abnormality. Electronically Signed   By: Obie Dredge M.D.   On: 08/26/2021 14:41    Procedures Procedures (including critical care time)  Medications Ordered in UC Medications - No data to display  Initial Impression / Assessment and Plan / UC Course  I have reviewed the triage vital signs and the nursing notes.  Pertinent labs & imaging results that were available during my care of the patient were reviewed by me and considered in my medical decision making (see chart for details).    Begin prednisone burst/taper. Followup with Dr[facility] if not improving about two weeks.       Final Clinical Impressions(s) / UC Diagnoses   Final diagnoses:  Acute right lumbar radiculopathy     Discharge Instructions      Apply ice pack mid-lower back for 20 to 30 minutes, 3 to 4 times daily  Continue until pain decreases.  Continue lidocaine patch at bedtime for 12 hours.     ED Prescriptions     Medication Sig Dispense Auth. Provider   predniSONE (DELTASONE) 20 MG tablet Take one tab by mouth twice daily for 4 days, then one daily.. Take with food. 12 tablet Lattie Haw, MD         Lattie Haw, MD 08/27/21 [phone]

## 2023-08-04 IMAGING — DX DG LUMBAR SPINE COMPLETE 4+V
5 series · 5 of 5 positions shown · non-contrast
Comparison: None.

CLINICAL DATA: Right lower back pain radiating to the right leg for
the past week.

EXAM:
LUMBAR SPINE - COMPLETE 4+ VIEW

[l-spine ap]
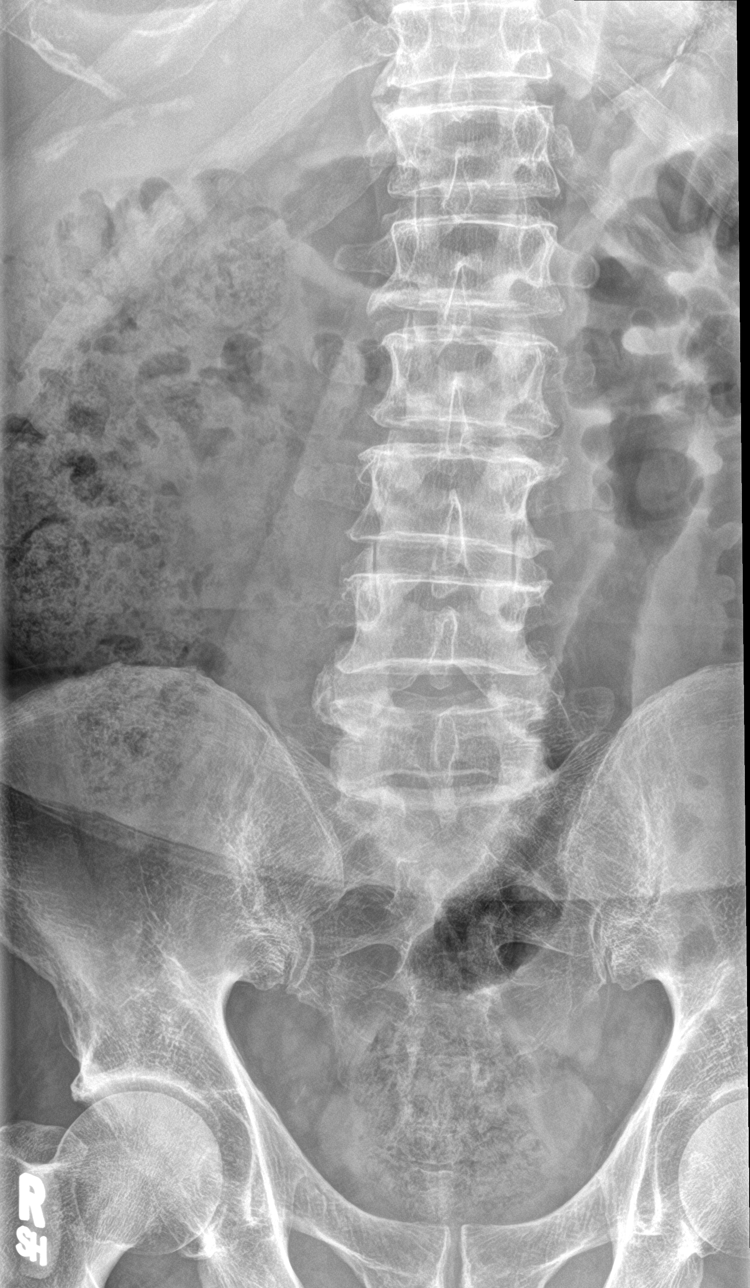

[l-spine obl (1 of 2)]
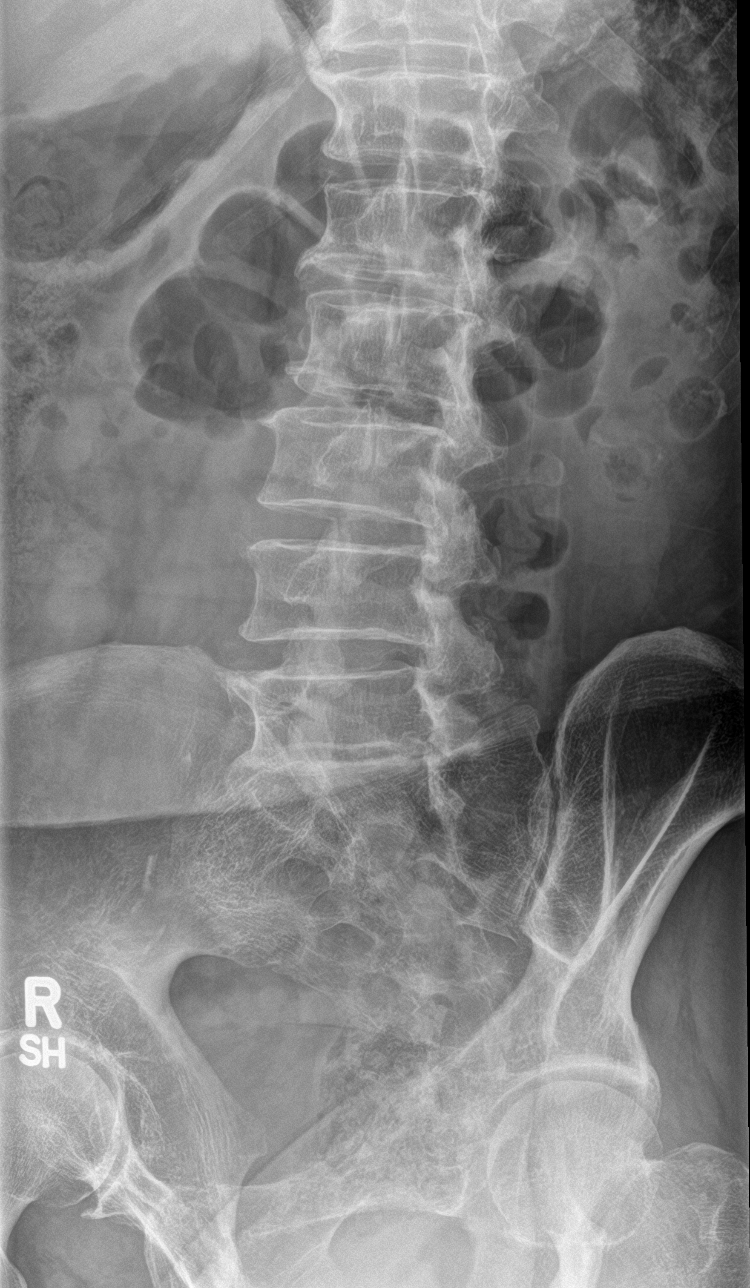

[l-spine obl (2 of 2)]
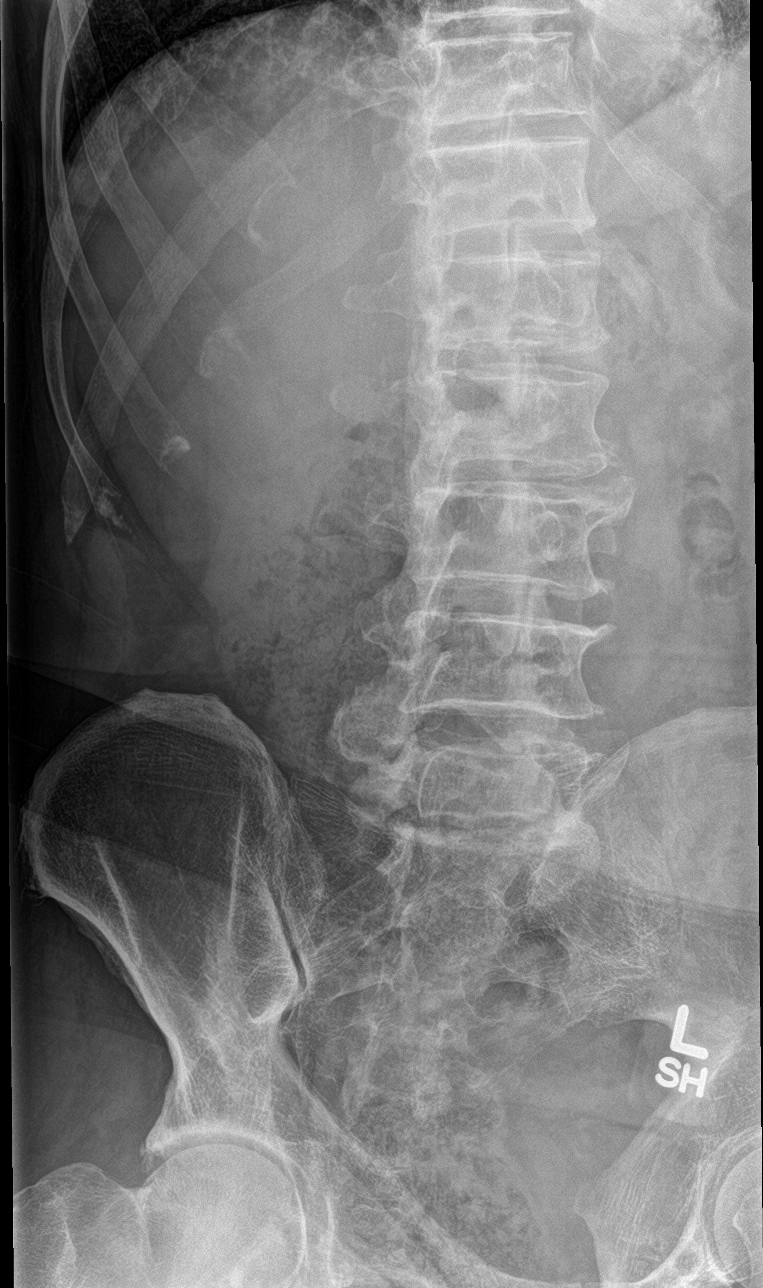

[l-spine lat]
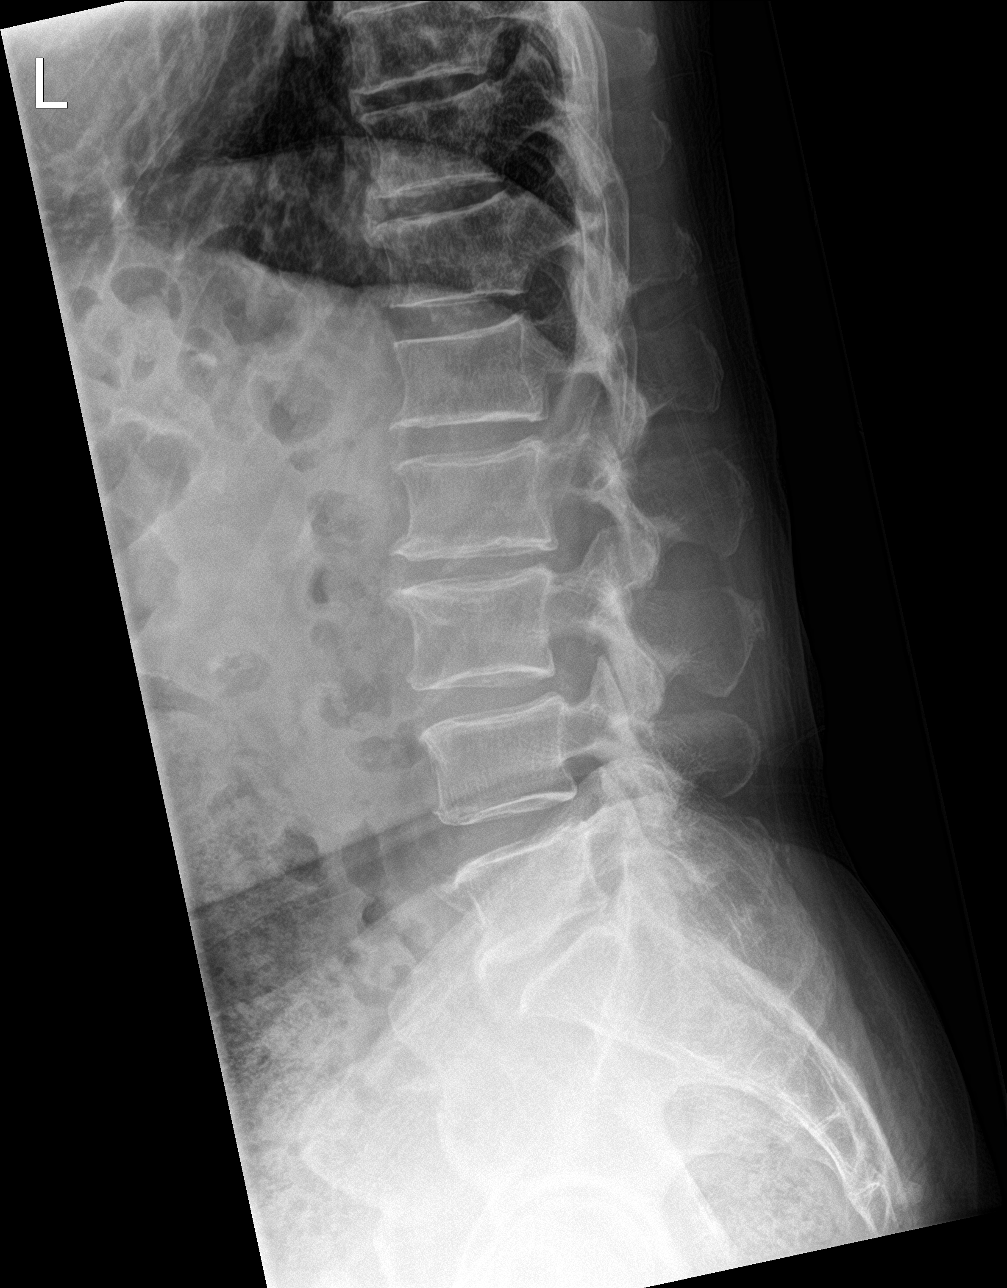

[l-spine spot]
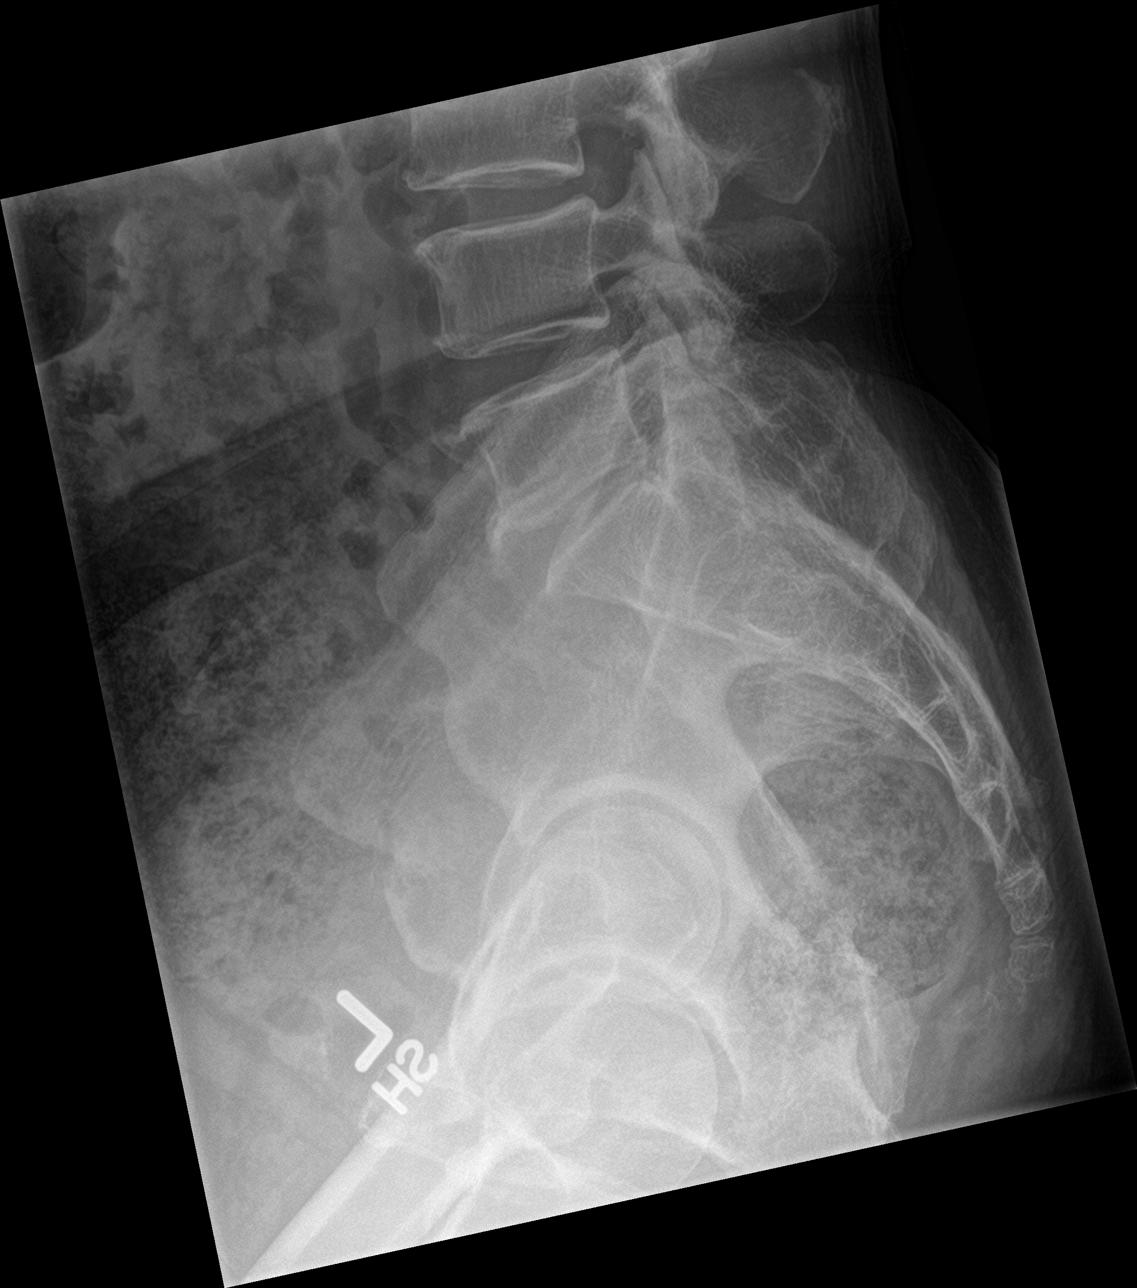

[5 of 5 positions shown; findings below may reference images not displayed]

FINDINGS: Five lumbar type vertebral bodies.

No acute fracture or subluxation. Vertebral body heights are
preserved.

Alignment is normal.

Mild disc height loss at L2-L3 and L5-S1. Mild diffuse lumbar facet
arthropathy.

The sacroiliac joints are unremarkable.
IMPRESSION: 1. Mild lumbar spondylosis. No acute osseous abnormality.
